# Patient Record
Sex: Female | Born: 1955 | Race: White | Hispanic: No | Marital: Married | Smoking: Never smoker
Health system: Southern US, Community
[De-identification: ages and names within clinical notes are randomized; demographics above are authoritative.]

---

## 2004-08-12 ENCOUNTER — Other Ambulatory Visit: Admission: RE | Admit: 2004-08-12 | Discharge: 2004-08-12 | Payer: Self-pay | Admitting: Diagnostic Radiology

## 2008-06-20 ENCOUNTER — Ambulatory Visit (HOSPITAL_COMMUNITY): Admission: RE | Admit: 2008-06-20 | Discharge: 2008-06-22 | Payer: Self-pay | Admitting: Surgery

## 2008-06-20 ENCOUNTER — Encounter (INDEPENDENT_AMBULATORY_CARE_PROVIDER_SITE_OTHER): Payer: Self-pay | Admitting: Surgery

## 2008-08-01 IMAGING — CR DG CHEST 2V
2 series · 2 of 2 positions shown · non-contrast
Comparison: None

CLINICAL DATA: Preoperative respiratory exam for thyroidectomy

CHEST - 2 VIEW

[view not recorded (1 of 2)]
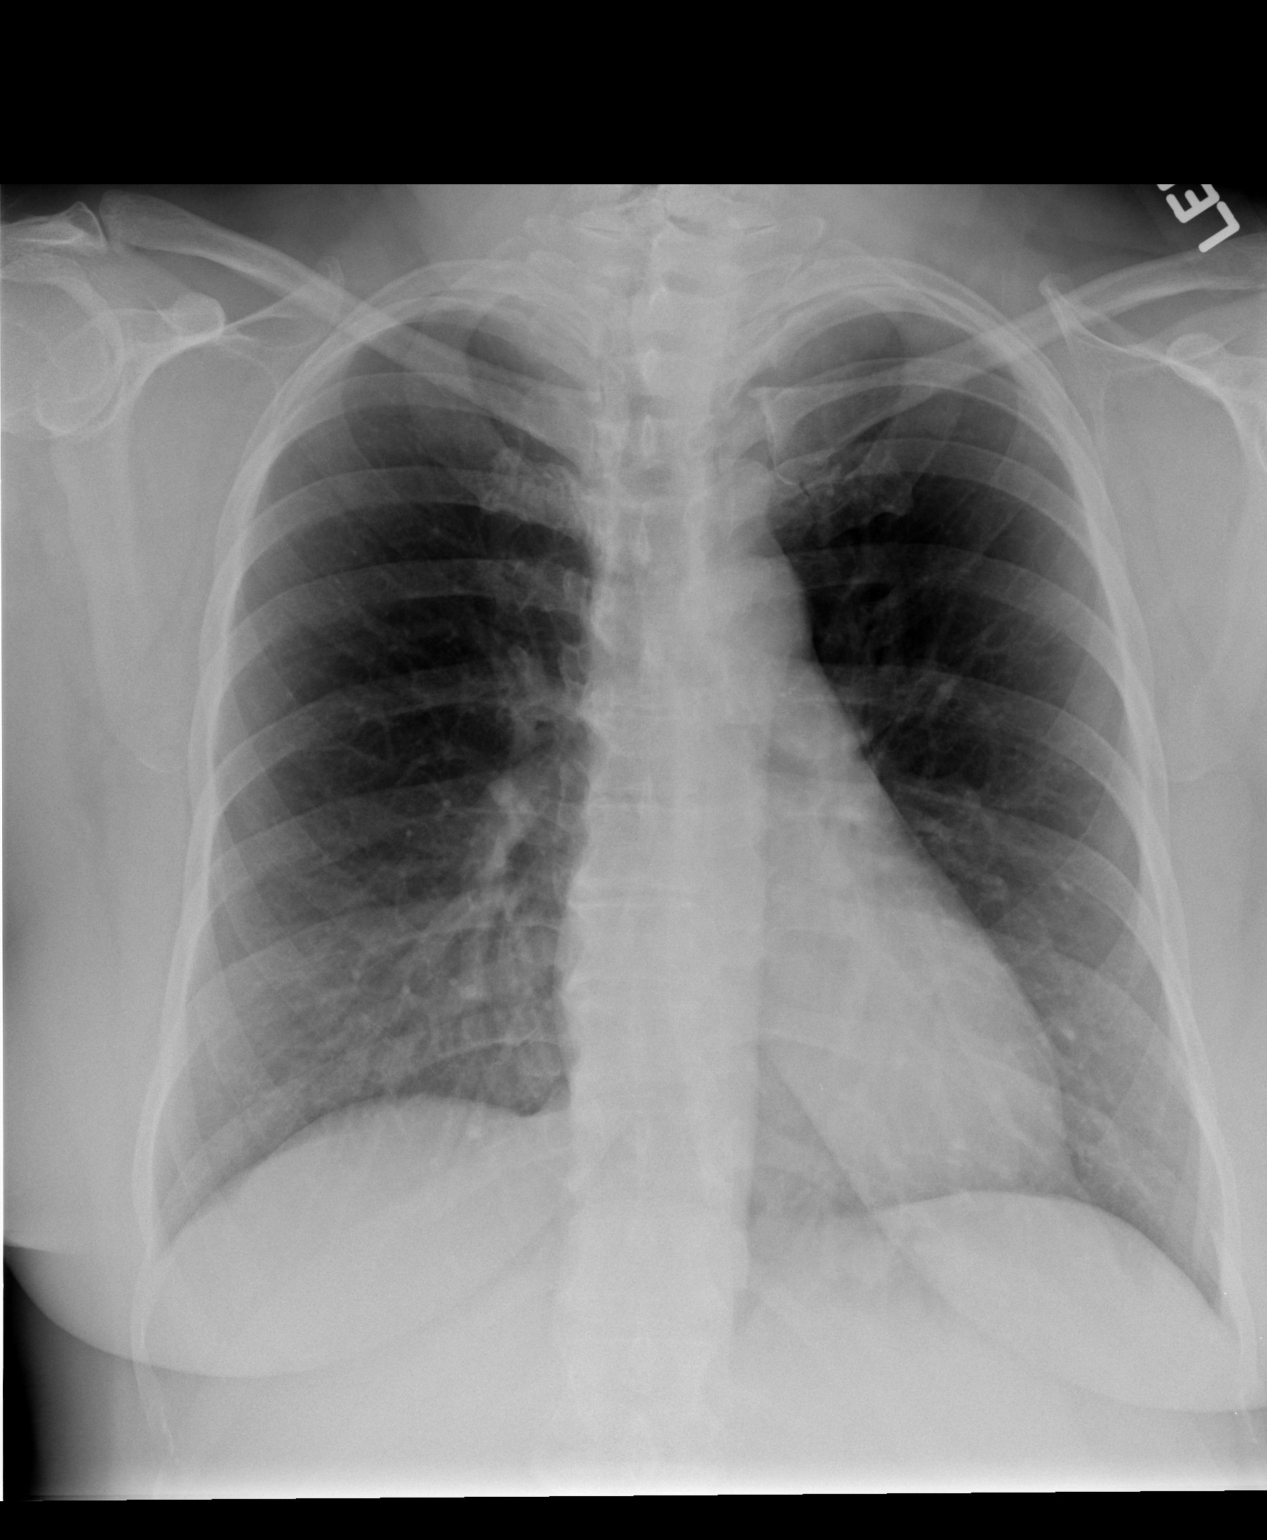

[view not recorded (2 of 2)]
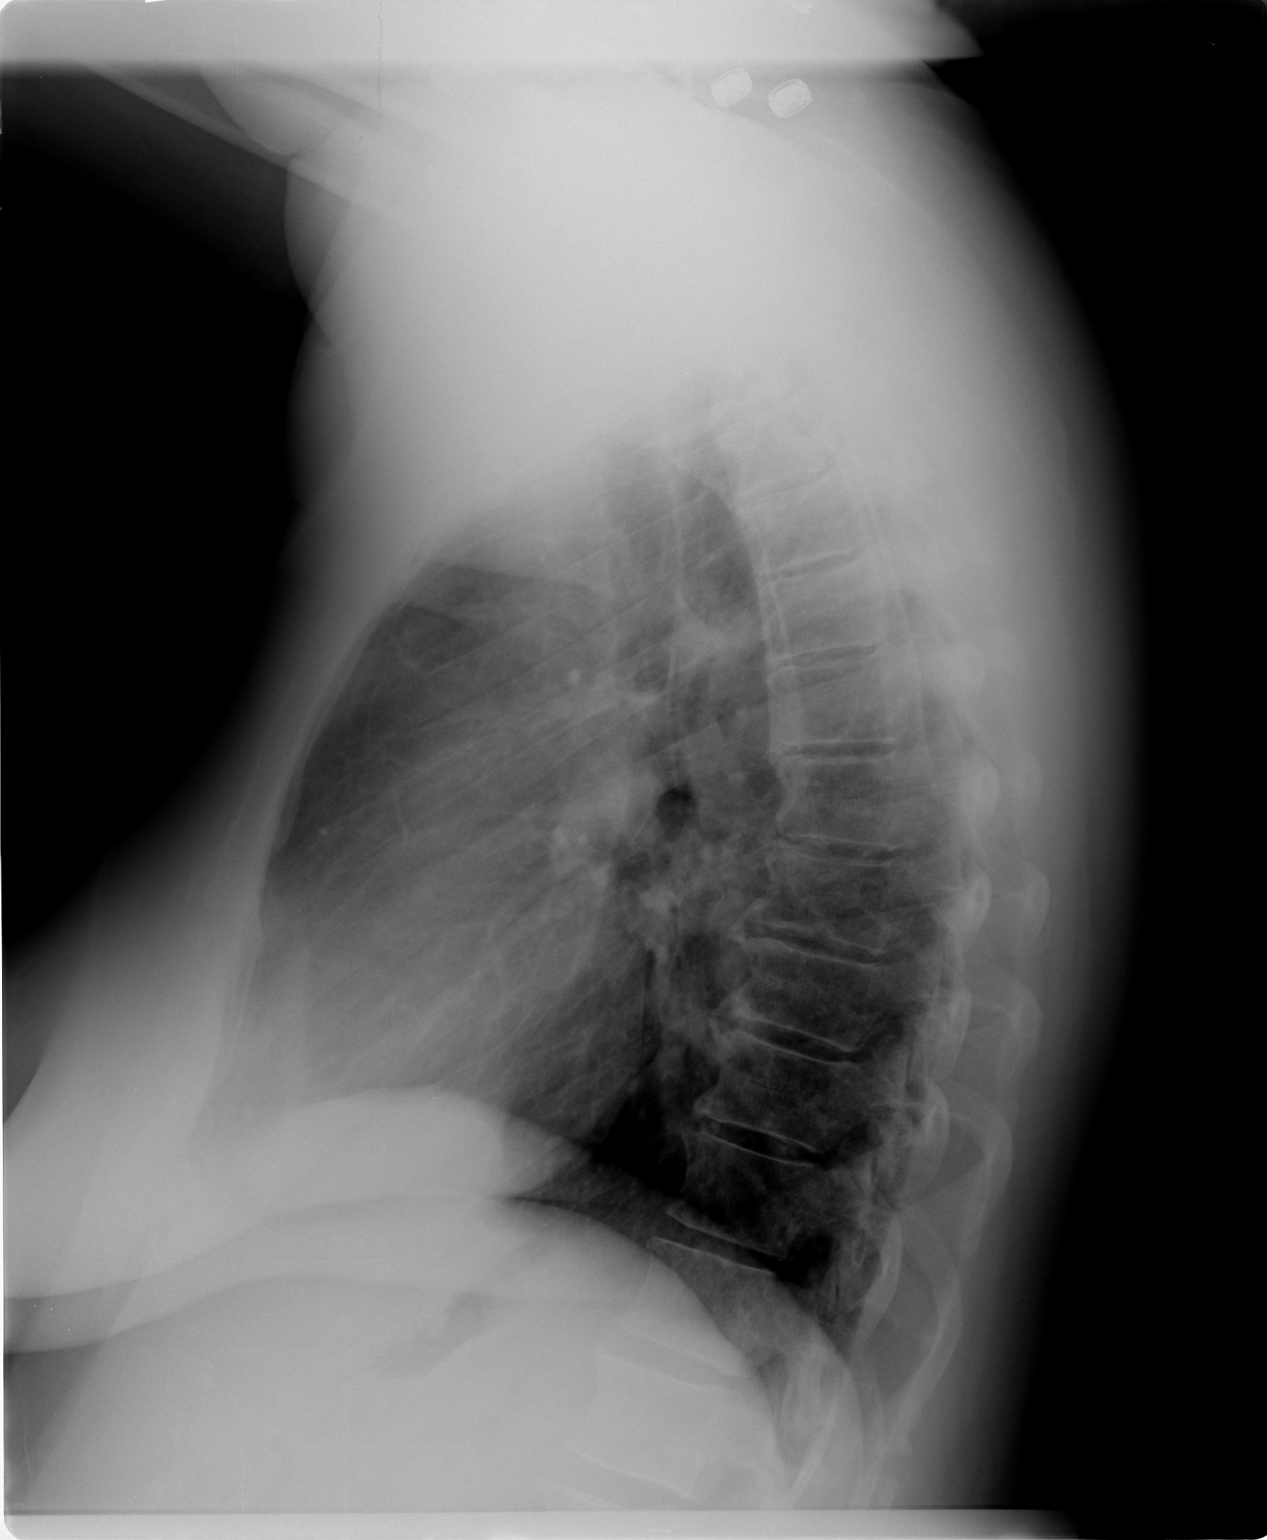

[2 of 2 positions shown; findings below may reference images not displayed]

FINDINGS: Heart size is normal.  There is a superior mediastinal
mass on the left consistent with extension of the thyroid goiter to
the thoracic inlet.  The lungs are clear.  No effusions.  Ordinary
degenerative changes effect the spine.
IMPRESSION: Neck/superior mediastinal mass on the left.  No active
cardiopulmonary process.

## 2011-04-20 NOTE — Op Note (Signed)
[name], [name]                   ACCOUNT NO.:  [id number]   MEDICAL RECORD NO.:  [id number]          PATIENT TYPE:  OIB   LOCATION:  3304                         FACILITY:  [facility]   PHYSICIAN:  Velora Heckler, MD      DATE OF BIRTH:  [birth year]   DATE OF PROCEDURE:  06/20/2008  DATE OF DISCHARGE:                               OPERATIVE REPORT   PREOPERATIVE DIAGNOSIS:  Thyroid goiter with compressive symptoms.   POSTOPERATIVE DIAGNOSIS:  Thyroid goiter with compressive symptoms.   PROCEDURE:  Total thyroidectomy.   SURGEON:  Velora Heckler, MD, FACS   ASSISTANT:  Juanetta Gosling, MD   ANESTHESIA:  General per Dr Jairo Ben, MD   ESTIMATED BLOOD LOSS:  150 mL   PREPARATION:  Betadine.   COMPLICATIONS:  None.   INDICATIONS:  The patient is a 55 year old white female from  [place], [place].  She has a 25-year history of known thyroid  goiter on thyroid hormone suppressive therapy with Synthroid.  She has  had previous fine-needle biopsies, which have shown no suspicion of  malignancy.  However, the gland has continued to enlarge and she has  developed compressive symptoms including solid food dysphagia and  shortness of breath.  The patient was referred by Dr. Casimiro Needle Altheimer  for consideration for thyroidectomy.   BODY OF REPORT:  Procedure was done in OR #17 at the [facility].  The patient was brought to the operating room,  placed in a supine position on the operative room table.  Anesthesia was  induced by Dr. Jairo Ben.  The patient underwent an awake  intubation with some difficulty.  It took greater than 1 hour to achieve  an airway under controlled circumstances.  The patient tolerated this  well.   After successful intubation and administration of general anesthesia,  the patient was positioned and then prepped and draped in the usual  strict aseptic fashion.  After ascertaining that an adequate level of  anesthesia has been achieved, a Kocher incision was made with a #15  blade.  Dissection was carried through subcutaneous tissues and platysma  and hemostasis obtained with electrocautery.  External jugular veins  were divided between hemostats and ligated with 2-0 silk ties.  Skin  flaps were elevated cephalad and caudad from the thyroid notch to the  sternal notch.  A Mahorner self-retaining retractor was placed for  exposure.  Strap muscles were incised in the midline.  There was a  dominant mass measuring approximately 5-6 cm in size in the thyroid  isthmus.  This was gently dissected out.  Gland was quite vascular.  Dissection was begun on the left side.  Strap muscles were reflected  laterally and the left thyroid lobe was exposed.  Left lobe was markedly  enlarged.  It measured approximately 10-12 cm in greatest dimension.  With gentle blunt dissection, the gland was mobilized and delivered  anterior to the strap muscles.  Superior pole vessels were then divided  between hemoclips with the harmonic scalpel and larger vessels were  ligated  in continuity with 2-0 silk ties and then divided with the  harmonic scalpel.  This provides for better mobilization of the gland.  Inferior venous tributaries were ligated in continuity with 2-0 silk  ties and divided with harmonic scalpel.  Gland was rolled anteriorly.  Parathyroid tissue was identified and preserved.  Dissection was  maintained on the thyroid capsule in hopes of avoiding injury to the  underlying recurrent nerve, which was difficult to visualize given the  size and distortion of the neck anatomy.  Dissection was carried down to  branches of the inferior thyroid artery, which were divided between  small Ligaclips with harmonic scalpel.  Ligament of Allyson Sabal was transected  and the gland was mobilized up and onto the anterior trachea.  A small  pyramidal lobe was dissected out and included with the specimen.  Isthmus was mobilized  across the midline with the electrocautery used  for hemostasis.  Dry packs were placed in the left neck.   Next, we turned our attention to the right thyroid lobe.  Again strap  muscles were reflected laterally and the right lobe was exposed.  Right  lobe was not as large as the left lobe.  It measured approximately 8 cm  in greatest dimension.  However, the right lobe was located more  posteriorly and extends to the precervical fascia behind the esophagus.  It was gently mobilized.  Superior pole vessels were ligated in  continuity with 2-0 silk ties and divided with the harmonic scalpel.  Remaining vascular tributaries to the superior pole were divided between  medium Ligaclips with the harmonic scalpel.  Middle thyroid vein was  divided between medium Ligaclips with harmonic scalpel.  Inferior venous  tributaries were ligated in continuity with 2-0 silk ties and divided  with the harmonic scalpel.  Gland was rolled anteriorly.  Both the  superior and inferior parathyroid glands were dissected off the thyroid  capsule and maintained on their vascular pedicle.  The branches of the  inferior thyroid artery were divided between Ligaclips with the harmonic  scalpel.  The gland was rolled anteriorly.  Ligament of Allyson Sabal was  transected with the electrocautery.  Gland was mobilized onto the  anterior trachea from which it was completely excised with the harmonic  scalpel.  Sutures used to mark the left superior pole.  Gland was  sectioned on the back table for instructional purposes as there were  students present.  The entire gland was submitted to Pathology for  review.  Neck was irrigated copiously with warm saline and good  hemostasis was achieved.  Surgicel was placed in the operative field.  Strap muscles were reapproximated in the midline with interrupted 3-0  Vicryl sutures.  Platysma was closed with interrupted 3-0 Vicryl  sutures.  Skin was closed with running 4-0 Monocryl  subcuticular suture.  Wound was washed and dried and Benzoin and Steri-Strips were applied.  Five small skin tags were excised off the neck with the use of the  electrocautery.  Dressings were then applied.  The patient was awakened  from anesthesia.  Dr. Jairo Ben inserted a glide scope and  visualized the cords easily before extubation.  The patient was brought  to the recovery room in stable condition.  The patient tolerated the  procedure well.      Velora Heckler, MD  Electronically Signed     TMG/MEDQ  D:  06/20/2008  T:  06/21/2008  Job:  [id number]   cc:   Veverly Fells.  Altheimer, M.D.  [facility] [place] [facility]

## 2011-09-02 LAB — COMPREHENSIVE METABOLIC PANEL
AST: 23
Albumin: 4.5
Calcium: 9.6
Creatinine, Ser: 1.02
GFR calc Af Amer: >60
GFR calc non Af Amer: 57 — ABNORMAL LOW
Total Protein: 7

## 2011-09-02 LAB — URINALYSIS, ROUTINE W REFLEX MICROSCOPIC
Hgb urine dipstick: NEGATIVE
Specific Gravity, Urine: 1.022
Urobilinogen, UA: 0.2

## 2011-09-02 LAB — URINE MICROSCOPIC-ADD ON

## 2011-09-02 LAB — CBC
MCHC: 34.4
MCV: 93.8
Platelets: 312
RDW: 13.2

## 2011-09-02 LAB — PROTIME-INR: Prothrombin Time: 12

## 2011-09-02 LAB — DIFFERENTIAL
Eosinophils Relative: 3
Lymphocytes Relative: 24
Lymphs Abs: 2.4
Monocytes Absolute: 0.6

## 2011-09-03 LAB — CALCIUM: Calcium: 8.4

## 2013-05-12 DIAGNOSIS — M5137 Other intervertebral disc degeneration, lumbosacral region: Secondary | ICD-10-CM | POA: Insufficient documentation

## 2013-05-12 DIAGNOSIS — E119 Type 2 diabetes mellitus without complications: Secondary | ICD-10-CM | POA: Insufficient documentation

## 2013-05-12 DIAGNOSIS — F329 Major depressive disorder, single episode, unspecified: Secondary | ICD-10-CM | POA: Insufficient documentation

## 2015-09-10 DIAGNOSIS — I10 Essential (primary) hypertension: Secondary | ICD-10-CM | POA: Insufficient documentation

## 2015-09-10 DIAGNOSIS — Z79810 Long term (current) use of selective estrogen receptor modulators (SERMs): Secondary | ICD-10-CM | POA: Insufficient documentation

## 2015-09-10 DIAGNOSIS — R7989 Other specified abnormal findings of blood chemistry: Secondary | ICD-10-CM | POA: Insufficient documentation

## 2015-09-10 DIAGNOSIS — C50911 Malignant neoplasm of unspecified site of right female breast: Secondary | ICD-10-CM | POA: Insufficient documentation

## 2016-01-15 DIAGNOSIS — R5383 Other fatigue: Secondary | ICD-10-CM | POA: Insufficient documentation

## 2016-01-22 DIAGNOSIS — E039 Hypothyroidism, unspecified: Secondary | ICD-10-CM | POA: Insufficient documentation

## 2016-05-17 DIAGNOSIS — M545 Low back pain, unspecified: Secondary | ICD-10-CM | POA: Insufficient documentation

## 2016-05-17 DIAGNOSIS — G43009 Migraine without aura, not intractable, without status migrainosus: Secondary | ICD-10-CM | POA: Insufficient documentation

## 2016-05-17 DIAGNOSIS — M50222 Other cervical disc displacement at C5-C6 level: Secondary | ICD-10-CM | POA: Insufficient documentation

## 2016-05-17 DIAGNOSIS — G5681 Other specified mononeuropathies of right upper limb: Secondary | ICD-10-CM | POA: Insufficient documentation

## 2016-08-24 DIAGNOSIS — J309 Allergic rhinitis, unspecified: Secondary | ICD-10-CM | POA: Insufficient documentation

## 2016-08-24 DIAGNOSIS — Z853 Personal history of malignant neoplasm of breast: Secondary | ICD-10-CM | POA: Insufficient documentation

## 2016-09-01 DIAGNOSIS — M6701 Short Achilles tendon (acquired), right ankle: Secondary | ICD-10-CM | POA: Insufficient documentation

## 2016-09-01 DIAGNOSIS — M2011 Hallux valgus (acquired), right foot: Secondary | ICD-10-CM | POA: Insufficient documentation

## 2016-09-20 DIAGNOSIS — M542 Cervicalgia: Secondary | ICD-10-CM | POA: Insufficient documentation

## 2017-02-07 DIAGNOSIS — E782 Mixed hyperlipidemia: Secondary | ICD-10-CM | POA: Insufficient documentation

## 2017-02-07 DIAGNOSIS — E559 Vitamin D deficiency, unspecified: Secondary | ICD-10-CM | POA: Insufficient documentation

## 2017-09-12 ENCOUNTER — Encounter: Payer: Self-pay | Admitting: Podiatry

## 2017-09-12 DIAGNOSIS — M21619 Bunion of unspecified foot: Secondary | ICD-10-CM | POA: Insufficient documentation

## 2017-09-12 NOTE — Patient Instructions (Signed)

## 2017-09-28 ENCOUNTER — Ambulatory Visit (INDEPENDENT_AMBULATORY_CARE_PROVIDER_SITE_OTHER): Payer: 59 | Admitting: Podiatry

## 2017-09-28 ENCOUNTER — Ambulatory Visit (INDEPENDENT_AMBULATORY_CARE_PROVIDER_SITE_OTHER): Payer: 59

## 2017-09-28 VITALS — BP 108/66 | HR 63 | Resp 16 | Ht 63.0 in | Wt 190.0 lb

## 2017-09-28 DIAGNOSIS — M2010 Hallux valgus (acquired), unspecified foot: Secondary | ICD-10-CM

## 2017-09-28 DIAGNOSIS — M7661 Achilles tendinitis, right leg: Secondary | ICD-10-CM | POA: Diagnosis not present

## 2017-09-28 DIAGNOSIS — M722 Plantar fascial fibromatosis: Secondary | ICD-10-CM | POA: Diagnosis not present

## 2017-09-28 MED ORDER — TRIAMCINOLONE ACETONIDE 10 MG/ML IJ SUSP
10.0000 mg | Freq: Once | INTRAMUSCULAR | Status: AC
  Administered 2017-09-28: 10 mg

## 2017-09-28 NOTE — Progress Notes (Signed)
Subjective:    Patient ID: [name], female   DOB: 61 y.o.   MRN: [id number]   HPI patient presents stating she has a painful bunion deformity right pain in the back of the right heel and the bottom of the right heel and states that she had had 1 injection which helped her temporarily but she is also had stretching exercises and try to boot which has not been successful in helping her. Has seen several other doctors over the last year for this problem and patient does not smoke    Review of Systems  All other systems reviewed and are negative.       Objective:  Physical Exam  Constitutional: She appears well-developed and well-nourished.  Cardiovascular: Intact distal pulses.   Pulmonary/Chest: Effort normal.  Musculoskeletal: Normal range of motion.  Neurological: She is alert.  Skin: Skin is warm.  Nursing note and vitals reviewed.  neurovascular status intact muscle strength adequate range of motion is within normal limits with patient found to have exquisite discomfort in the plantar aspect of the right heel and also discomfort at the musculotendinous junction posterior right heel and mild discomfort at the insertion to the posterior calcaneus. Also found to have large structural bunion deformity clinically with redness around the first metatarsal that she states is increasingly symptomatic over the last several years     Assessment:   Acute plantar fasciitis Achilles tendinitis right with possibility that the Achilles tendon is coming from walking differently because of the plantar heel and structural bunion deformity right with moderate hallux limitus symptoms      Plan:    H&P conditions reviewed and at this point I injected the plantar right heel 3 mg Kenalog 5 mg Xylocaine and applied fascial brace to lift the arch and advised on stretching exercises for the posterior heel. Discussed at one point bunion correction which will probably be necessary but at this time we'll get a  hold off and focus on the heel condition  X-ray indicates there is signs of hallux limitus with the spur on the medial aspect of the first metatarsal with moderate elevation of the intermetatarsal angle

## 2017-09-28 NOTE — Patient Instructions (Addendum)
Bunion A bunion is a bump on the base of the big toe that forms when the bones of the big toe joint move out of position. Bunions may be small at first, but they often get larger over time. The can make walking painful. What are the causes? A bunion may be caused by:  Wearing narrow or pointed shoes that force the big toe to press against the other toes.  Abnormal foot development that causes the foot to roll inward (pronate).  Changes in the foot that are caused by certain diseases, such as rheumatoid arthritis and polio.  A foot injury.  What increases the risk? The following factors may make you more likely to develop this condition:  Wearing shoes that squeeze the toes together.  Having certain diseases, such as: ? Rheumatoid arthritis. ? Polio. ? Cerebral palsy.  Having family members who have bunions.  Being born with a foot deformity, such as flat feet or low arches.  Doing activities that put a lot of pressure on the feet, such as ballet dancing.  What are the signs or symptoms? The main symptom of a bunion is a noticeable bump on the big toe. Other symptoms may include:  Pain.  Swelling around the big toe.  Redness and inflammation.  Thick or hardened skin on the big toe or between the toes.  Stiffness or loss of motion in the big toe.  Trouble with walking.  How is this diagnosed? A bunion may be diagnosed based on your symptoms, medical history, and activities. You may have tests, such as:  X-rays. These allow your health care provider to check the position of the bones in your foot and look for damage to your joint. They also help your health care provider to determine the severity of your bunion and the best way to treat it.  Joint aspiration. In this test, a sample of fluid is removed from the toe joint. This test, which may be done if you are in a lot of pain, helps to rule out diseases that cause painful swelling of the joints, such as  arthritis.  How is this treated? There is no cure for a bunion, but treatment can help to prevent a bunion from getting worse. Treatment depends on the severity of your symptoms. Your health care provider may recommend:  Wearing shoes that have a wide toe box.  Using bunion pads to cushion the affected area.  Taping your toes together to keep them in a normal position.  Placing a device inside your shoe (orthotics) to help reduce pressure on your toe joint.  Taking medicine to ease pain, inflammation, and swelling.  Applying heat or ice to the affected area.  Doing stretching exercises.  Surgery to remove scar tissue and move the toes back into their normal position. This treatment is rare.  Follow these instructions at home:  Support your toe joint with proper footwear, shoe padding, or taping as told by your health care provider.  Take over-the-counter and prescription medicines only as told by your health care provider.  If directed, apply ice to the injured area: ? Put ice in a plastic bag. ? Place a towel between your skin and the bag. ? Leave the ice on for 20 minutes, 2-3 times per day.  If directed, apply heat to the affected area before you exercise. Use the heat source that your health care provider recommends, such as a moist heat pack or a heating pad. ? Place a towel between your   skin and the heat source. ? Leave the heat on for 20-30 minutes. ? Remove the heat if your skin turns bright red. This is especially important if you are unable to feel pain, heat, or cold. You may have a greater risk of getting burned.  Do exercises as told by your health care provider.  Keep all follow-up visits as told by your health care provider. Contact a health care provider if:  Your symptoms get worse.  Your symptoms do not improve in 2 weeks. Get help right away if:  You have severe pain and trouble with walking. This information is not intended to replace advice given  to you by your health care provider. Make sure you discuss any questions you have with your health care provider. Document Released: 11/22/2005 Document Revised: 04/29/2016 Document Reviewed: 06/22/2015 [employer] Interactive Patient Education  2018 [place].    Achilles Tendinitis  with Rehab Achilles tendinitis is a disorder of the Achilles tendon. The Achilles tendon connects the large calf muscles (Gastrocnemius and Soleus) to the heel bone (calcaneus). This tendon is sometimes called the heel cord. It is important for pushing-off and standing on your toes and is important for walking, running, or jumping. Tendinitis is often caused by overuse and repetitive microtrauma. SYMPTOMS  Pain, tenderness, swelling, warmth, and redness may occur over the Achilles tendon even at rest.  Pain with pushing off, or flexing or extending the ankle.  Pain that is worsened after or during activity. CAUSES   Overuse sometimes seen with rapid increase in exercise programs or in sports requiring running and jumping.  Poor physical conditioning (strength and flexibility or endurance).  Running sports, especially training running down hills.  Inadequate warm-up before practice or play or failure to stretch before participation.  Injury to the tendon. PREVENTION   Warm up and stretch before practice or competition.  Allow time for adequate rest and recovery between practices and competition.  Keep up conditioning.  Keep up ankle and leg flexibility.  Improve or keep muscle strength and endurance.  Improve cardiovascular fitness.  Use proper technique.  Use proper equipment (shoes, skates).  To help prevent recurrence, taping, protective strapping, or an adhesive bandage may be recommended for several weeks after healing is complete. PROGNOSIS   Recovery may take weeks to several months to heal.  Longer recovery is expected if symptoms have been prolonged.  Recovery is usually  quicker if the inflammation is due to a direct blow as compared with overuse or sudden strain. RELATED COMPLICATIONS   Healing time will be prolonged if the condition is not correctly treated. The injury must be given plenty of time to heal.  Symptoms can reoccur if activity is resumed too soon.  Untreated, tendinitis may increase the risk of tendon rupture requiring additional time for recovery and possibly surgery. TREATMENT   The first treatment consists of rest anti-inflammatory medication, and ice to relieve the pain.  Stretching and strengthening exercises after resolution of pain will likely help reduce the risk of recurrence. Referral to a physical therapist or athletic trainer for further evaluation and treatment may be helpful.  A walking boot or cast may be recommended to rest the Achilles tendon. This can help break the cycle of inflammation and microtrauma.  Arch supports (orthotics) may be prescribed or recommended by your caregiver as an adjunct to therapy and rest.  Surgery to remove the inflamed tendon lining or degenerated tendon tissue is rarely necessary and has shown less than predictable results. MEDICATION  Nonsteroidal anti-inflammatory medications, such as aspirin and ibuprofen, may be used for pain and inflammation relief. Do not take within 7 days before surgery. Take these as directed by your caregiver. Contact your caregiver immediately if any bleeding, stomach upset, or signs of allergic reaction occur. Other minor pain relievers, such as acetaminophen, may also be used.  Pain relievers may be prescribed as necessary by your caregiver. Do not take prescription pain medication for longer than 4 to 7 days. Use only as directed and only as much as you need.  Cortisone injections are rarely indicated. Cortisone injections may weaken tendons and predispose to rupture. It is better to give the condition more time to heal than to use them. HEAT AND COLD  Cold is  used to relieve pain and reduce inflammation for acute and chronic Achilles tendinitis. Cold should be applied for 10 to 15 minutes every 2 to 3 hours for inflammation and pain and immediately after any activity that aggravates your symptoms. Use ice packs or an ice massage.  Heat may be used before performing stretching and strengthening activities prescribed by your caregiver. Use a heat pack or a warm soak. SEEK MEDICAL CARE IF:  Symptoms get worse or do not improve in 2 weeks despite treatment.  New, unexplained symptoms develop. Drugs used in treatment may produce side effects.  EXERCISES:  RANGE OF MOTION (ROM) AND STRETCHING EXERCISES - Achilles Tendinitis  These exercises may help you when beginning to rehabilitate your injury. Your symptoms may resolve with or without further involvement from your physician, physical therapist or athletic trainer. While completing these exercises, remember:   Restoring tissue flexibility helps normal motion to return to the joints. This allows healthier, less painful movement and activity.  An effective stretch should be held for at least 30 seconds.  A stretch should never be painful. You should only feel a gentle lengthening or release in the stretched tissue.  STRETCH  Gastroc, Standing   Place hands on wall.  Extend right / left leg, keeping the front knee somewhat bent.  Slightly point your toes inward on your back foot.  Keeping your right / left heel on the floor and your knee straight, shift your weight toward the wall, not allowing your back to arch.  You should feel a gentle stretch in the right / left calf. Hold this position for 10 seconds. Repeat 3 times. Complete this stretch 2 times per day.  STRETCH  Soleus, Standing   Place hands on wall.  Extend right / left leg, keeping the other knee somewhat bent.  Slightly point your toes inward on your back foot.  Keep your right / left heel on the floor, bend your back knee,  and slightly shift your weight over the back leg so that you feel a gentle stretch deep in your back calf.  Hold this position for 10 seconds. Repeat 3 times. Complete this stretch 2 times per day.  STRETCH  Gastrocsoleus, Standing  Note: This exercise can place a lot of stress on your foot and ankle. Please complete this exercise only if specifically instructed by your caregiver.   Place the ball of your right / left foot on a step, keeping your other foot firmly on the same step.  Hold on to the wall or a rail for balance.  Slowly lift your other foot, allowing your body weight to press your heel down over the edge of the step.  You should feel a stretch in your right / left  calf.  Hold this position for 10 seconds.  Repeat this exercise with a slight bend in your knee. Repeat 3 times. Complete this stretch 2 times per day.   STRENGTHENING EXERCISES - Achilles Tendinitis These exercises may help you when beginning to rehabilitate your injury. They may resolve your symptoms with or without further involvement from your physician, physical therapist or athletic trainer. While completing these exercises, remember:   Muscles can gain both the endurance and the strength needed for everyday activities through controlled exercises.  Complete these exercises as instructed by your physician, physical therapist or athletic trainer. Progress the resistance and repetitions only as guided.  You may experience muscle soreness or fatigue, but the pain or discomfort you are trying to eliminate should never worsen during these exercises. If this pain does worsen, stop and make certain you are following the directions exactly. If the pain is still present after adjustments, discontinue the exercise until you can discuss the trouble with your clinician.  STRENGTH - Plantar-flexors   Sit with your right / left leg extended. Holding onto both ends of a rubber exercise band/tubing, loop it around the  ball of your foot. Keep a slight tension in the band.  Slowly push your toes away from you, pointing them downward.  Hold this position for 10 seconds. Return slowly, controlling the tension in the band/tubing. Repeat 3 times. Complete this exercise 2 times per day.   STRENGTH - Plantar-flexors   Stand with your feet shoulder width apart. Steady yourself with a wall or table using as little support as needed.  Keeping your weight evenly spread over the width of your feet, rise up on your toes.*  Hold this position for 10 seconds. Repeat 3 times. Complete this exercise 2 times per day.  *If this is too easy, shift your weight toward your right / left leg until you feel challenged. Ultimately, you may be asked to do this exercise with your right / left foot only.  STRENGTH  Plantar-flexors, Eccentric  Note: This exercise can place a lot of stress on your foot and ankle. Please complete this exercise only if specifically instructed by your caregiver.   Place the balls of your feet on a step. With your hands, use only enough support from a wall or rail to keep your balance.  Keep your knees straight and rise up on your toes.  Slowly shift your weight entirely to your right / left toes and pick up your opposite foot. Gently and with controlled movement, lower your weight through your right / left foot so that your heel drops below the level of the step. You will feel a slight stretch in the back of your calf at the end position.  Use the healthy leg to help rise up onto the balls of both feet, then lower weight only on the right / left leg again. Build up to 15 repetitions. Then progress to 3 consecutive sets of 15 repetitions.*  After completing the above exercise, complete the same exercise with a slight knee bend (about 30 degrees). Again, build up to 15 repetitions. Then progress to 3 consecutive sets of 15 repetitions.* Perform this exercise 2 times per day.  *When you easily complete 3  sets of 15, your physician, physical therapist or athletic trainer may advise you to add resistance by wearing a backpack filled with additional weight.  STRENGTH - Plantar Flexors, Seated   Sit on a chair that allows your feet to rest flat on the  ground. If necessary, sit at the edge of the chair.  Keeping your toes firmly on the ground, lift your right / left heel as far as you can without increasing any discomfort in your ankle. Repeat 3 times. Complete this exercise 2 times a day.   Plantar Fasciitis (Heel Spur Syndrome) with Rehab The plantar fascia is a fibrous, ligament-like, soft-tissue structure that spans the bottom of the foot. Plantar fasciitis is a condition that causes pain in the foot due to inflammation of the tissue. SYMPTOMS   Pain and tenderness on the underneath side of the foot.  Pain that worsens with standing or walking. CAUSES  Plantar fasciitis is caused by irritation and injury to the plantar fascia on the underneath side of the foot. Common mechanisms of injury include:  Direct trauma to bottom of the foot.  Damage to a small nerve that runs under the foot where the main fascia attaches to the heel bone.  Stress placed on the plantar fascia due to bone spurs. RISK INCREASES WITH:   Activities that place stress on the plantar fascia (running, jumping, pivoting, or cutting).  Poor strength and flexibility.  Improperly fitted shoes.  Tight calf muscles.  Flat feet.  Failure to warm-up properly before activity.  Obesity. PREVENTION  Warm up and stretch properly before activity.  Allow for adequate recovery between workouts.  Maintain physical fitness:  Strength, flexibility, and endurance.  Cardiovascular fitness.  Maintain a health body weight.  Avoid stress on the plantar fascia.  Wear properly fitted shoes, including arch supports for individuals who have flat feet.  PROGNOSIS  If treated properly, then the symptoms of plantar  fasciitis usually resolve without surgery. However, occasionally surgery is necessary.  RELATED COMPLICATIONS   Recurrent symptoms that may result in a chronic condition.  Problems of the lower back that are caused by compensating for the injury, such as limping.  Pain or weakness of the foot during push-off following surgery.  Chronic inflammation, scarring, and partial or complete fascia tear, occurring more often from repeated injections.  TREATMENT  Treatment initially involves the use of ice and medication to help reduce pain and inflammation. The use of strengthening and stretching exercises may help reduce pain with activity, especially stretches of the Achilles tendon. These exercises may be performed at home or with a therapist. Your caregiver may recommend that you use heel cups of arch supports to help reduce stress on the plantar fascia. Occasionally, corticosteroid injections are given to reduce inflammation. If symptoms persist for greater than 6 months despite non-surgical (conservative), then surgery may be recommended.   MEDICATION   If pain medication is necessary, then nonsteroidal anti-inflammatory medications, such as aspirin and ibuprofen, or other minor pain relievers, such as acetaminophen, are often recommended.  Do not take pain medication within 7 days before surgery.  Prescription pain relievers may be given if deemed necessary by your caregiver. Use only as directed and only as much as you need.  Corticosteroid injections may be given by your caregiver. These injections should be reserved for the most serious cases, because they may only be given a certain number of times.  HEAT AND COLD  Cold treatment (icing) relieves pain and reduces inflammation. Cold treatment should be applied for 10 to 15 minutes every 2 to 3 hours for inflammation and pain and immediately after any activity that aggravates your symptoms. Use ice packs or massage the area with a piece of  ice (ice massage).  Heat treatment may be  used prior to performing the stretching and strengthening activities prescribed by your caregiver, physical therapist, or athletic trainer. Use a heat pack or soak the injury in warm water.  SEEK IMMEDIATE MEDICAL CARE IF:  Treatment seems to offer no benefit, or the condition worsens.  Any medications produce adverse side effects.  EXERCISES- RANGE OF MOTION (ROM) AND STRETCHING EXERCISES - Plantar Fasciitis (Heel Spur Syndrome) These exercises may help you when beginning to rehabilitate your injury. Your symptoms may resolve with or without further involvement from your physician, physical therapist or athletic trainer. While completing these exercises, remember:   Restoring tissue flexibility helps normal motion to return to the joints. This allows healthier, less painful movement and activity.  An effective stretch should be held for at least 30 seconds.  A stretch should never be painful. You should only feel a gentle lengthening or release in the stretched tissue.  RANGE OF MOTION - Toe Extension, Flexion  Sit with your right / left leg crossed over your opposite knee.  Grasp your toes and gently pull them back toward the top of your foot. You should feel a stretch on the bottom of your toes and/or foot.  Hold this stretch for 10 seconds.  Now, gently pull your toes toward the bottom of your foot. You should feel a stretch on the top of your toes and or foot.  Hold this stretch for 10 seconds. Repeat  times. Complete this stretch 3 times per day.   RANGE OF MOTION - Ankle Dorsiflexion, Active Assisted  Remove shoes and sit on a chair that is preferably not on a carpeted surface.  Place right / left foot under knee. Extend your opposite leg for support.  Keeping your heel down, slide your right / left foot back toward the chair until you feel a stretch at your ankle or calf. If you do not feel a stretch, slide your bottom forward  to the edge of the chair, while still keeping your heel down.  Hold this stretch for 10 seconds. Repeat 3 times. Complete this stretch 2 times per day.   STRETCH  Gastroc, Standing  Place hands on wall.  Extend right / left leg, keeping the front knee somewhat bent.  Slightly point your toes inward on your back foot.  Keeping your right / left heel on the floor and your knee straight, shift your weight toward the wall, not allowing your back to arch.  You should feel a gentle stretch in the right / left calf. Hold this position for 10 seconds. Repeat 3 times. Complete this stretch 2 times per day.  STRETCH  Soleus, Standing  Place hands on wall.  Extend right / left leg, keeping the other knee somewhat bent.  Slightly point your toes inward on your back foot.  Keep your right / left heel on the floor, bend your back knee, and slightly shift your weight over the back leg so that you feel a gentle stretch deep in your back calf.  Hold this position for 10 seconds. Repeat 3 times. Complete this stretch 2 times per day.  STRETCH  Gastrocsoleus, Standing  Note: This exercise can place a lot of stress on your foot and ankle. Please complete this exercise only if specifically instructed by your caregiver.   Place the ball of your right / left foot on a step, keeping your other foot firmly on the same step.  Hold on to the wall or a rail for balance.  Slowly lift your  other foot, allowing your body weight to press your heel down over the edge of the step.  You should feel a stretch in your right / left calf.  Hold this position for 10 seconds.  Repeat this exercise with a slight bend in your right / left knee. Repeat 3 times. Complete this stretch 2 times per day.   STRENGTHENING EXERCISES - Plantar Fasciitis (Heel Spur Syndrome)  These exercises may help you when beginning to rehabilitate your injury. They may resolve your symptoms with or without further involvement from  your physician, physical therapist or athletic trainer. While completing these exercises, remember:   Muscles can gain both the endurance and the strength needed for everyday activities through controlled exercises.  Complete these exercises as instructed by your physician, physical therapist or athletic trainer. Progress the resistance and repetitions only as guided.  STRENGTH - Towel Curls  Sit in a chair positioned on a non-carpeted surface.  Place your foot on a towel, keeping your heel on the floor.  Pull the towel toward your heel by only curling your toes. Keep your heel on the floor. Repeat 3 times. Complete this exercise 2 times per day.  STRENGTH - Ankle Inversion  Secure one end of a rubber exercise band/tubing to a fixed object (table, pole). Loop the other end around your foot just before your toes.  Place your fists between your knees. This will focus your strengthening at your ankle.  Slowly, pull your big toe up and in, making sure the band/tubing is positioned to resist the entire motion.  Hold this position for 10 seconds.  Have your muscles resist the band/tubing as it slowly pulls your foot back to the starting position. Repeat 3 times. Complete this exercises 2 times per day.  Document Released: 11/22/2005 Document Revised: 02/14/2012 Document Reviewed: 03/06/2009 [place] Patient Information 2014 [place], [place].

## 2017-10-05 NOTE — Progress Notes (Signed)
This encounter was created in error - please disregard.

## 2017-10-06 DIAGNOSIS — G894 Chronic pain syndrome: Secondary | ICD-10-CM | POA: Insufficient documentation

## 2017-10-06 DIAGNOSIS — M5416 Radiculopathy, lumbar region: Secondary | ICD-10-CM | POA: Insufficient documentation

## 2017-10-12 ENCOUNTER — Ambulatory Visit: Payer: 59 | Admitting: Podiatry

## 2017-10-12 ENCOUNTER — Encounter: Payer: Self-pay | Admitting: Podiatry

## 2017-10-12 DIAGNOSIS — M2010 Hallux valgus (acquired), unspecified foot: Secondary | ICD-10-CM

## 2017-10-12 DIAGNOSIS — M722 Plantar fascial fibromatosis: Secondary | ICD-10-CM | POA: Diagnosis not present

## 2017-10-12 NOTE — Patient Instructions (Signed)
Pre-Operative Instructions  Congratulations, you have decided to take an important step towards improving your quality of life.  You can be assured that the doctors and staff at [facility] will be with you every step of the way.  Here are some important things you should know:  1. Plan to be at the surgery center/hospital at least 1 (one) hour prior to your scheduled time, unless otherwise directed by the surgical center/hospital staff.  You must have a responsible adult accompany you, remain during the surgery and drive you home.  Make sure you have directions to the surgical center/hospital to ensure you arrive on time. 2. If you are having surgery at [facility] or [facility], you will need a copy of your medical history and physical form from your family physician within one month prior to the date of surgery. We will give you a form for your primary physician to complete.  3. We make every effort to accommodate the date you request for surgery.  However, there are times where surgery dates or times have to be moved.  We will contact you as soon as possible if a change in schedule is required.   4. No aspirin/ibuprofen for one week before surgery.  If you are on aspirin, any non-steroidal anti-inflammatory medications (Mobic, Aleve, Ibuprofen) should not be taken seven (7) days prior to your surgery.  You make take Tylenol for pain prior to surgery.  5. Medications - If you are taking daily heart and blood pressure medications, seizure, reflux, allergy, asthma, anxiety, pain or diabetes medications, make sure you notify the surgery center/hospital before the day of surgery so they can tell you which medications you should take or avoid the day of surgery. 6. No food or drink after midnight the night before surgery unless directed otherwise by surgical center/hospital staff. 7. No alcoholic beverages 24-hours prior to surgery.  No smoking 24-hours prior or 24-hours after  surgery. 8. Wear loose pants or shorts. They should be loose enough to fit over bandages, boots, and casts. 9. Don't wear slip-on shoes. Sneakers are preferred. 10. Bring your boot with you to the surgery center/hospital.  Also bring crutches or a walker if your physician has prescribed it for you.  If you do not have this equipment, it will be provided for you after surgery. 11. If you have not been contacted by the surgery center/hospital by the day before your surgery, call to confirm the date and time of your surgery. 12. Leave-time from work may vary depending on the type of surgery you have.  Appropriate arrangements should be made prior to surgery with your employer. 13. Prescriptions will be provided immediately following surgery by your doctor.  Fill these as soon as possible after surgery and take the medication as directed. Pain medications will not be refilled on weekends and must be approved by the doctor. 14. Remove nail polish on the operative foot and avoid getting pedicures prior to surgery. 15. Wash the night before surgery.  The night before surgery wash the foot and leg well with water and the antibacterial soap provided. Be sure to pay special attention to beneath the toenails and in between the toes.  Wash for at least three (3) minutes. Rinse thoroughly with water and dry well with a towel.  Perform this wash unless told not to do so by your physician.  Enclosed: 1 Ice pack (please put in freezer the night before surgery)   1 Hibiclens skin cleaner     Pre-op instructions  If you have any questions regarding the instructions, please do not hesitate to call our office.  [place]: [address], [place], [place] [address] -- [phone]  [place]: [address] [place], [place] [address] -- [phone]  [place]: [address]  [place], [place] [address] -- [phone]  [place]: [address], [address], [place], [place] [address] -- [phone]  Website:  [email or web address] 

## 2017-10-14 NOTE — Progress Notes (Signed)
Subjective:    Patient ID: [name], female   DOB: 61 y.o.   MRN: [id number]   HPI patient presents to discuss correction chronic bunion deformity stating the heel is feeling quite a bit better    ROS      Objective:  Physical Exam neurovascular status intact with hyperostosis medial aspect first metatarsal with redness pain noted with deviation of the hallux and also plantar pain which has improved     Assessment:     Improved fasciitis with structural bunion deformity    Plan:    H&P both conditions reviewed and recommended correction of bunion allowing patient to read consent form discussing alternative treatments complications. Patient wants surgery and after extensive review signs consent form is given all preoperative instructions and boot for the postoperative period patient will be seen back for [place] to recheck again in is encouraged to call with any questions and understands total recovery take 6 months to one year

## 2017-10-21 ENCOUNTER — Telehealth: Payer: Self-pay | Admitting: *Deleted

## 2017-10-21 NOTE — Telephone Encounter (Signed)
I'm returning your call.  Dr. Paulla Dolly said it's okay for you to continue taking the Kratom.  "I have another question.  Is it okay to get a pedicure?"  Do not get a pedicure.  Do not wear any toenail polish day of surgery.  "Okay, I knew about the nail polish.  I have some ugly toenails.  I just wanted to make sure."

## 2017-10-21 NOTE — Telephone Encounter (Signed)
"  Dr. Paulla Dolly is my doctor.  I've got surgery scheduled for my bunion on the Tuesday after Thanksgiving.  I think it's the 27th.  My question is do I need to stop taking Kratom, it's a pain killer I take for my back and my foot too.  I know you're supposed to stop some medications before surgery.  I'm not sure if I told him about that.  I just want to make sure of if I need to pull that back a week or two before the surgery.  He may not have even heard of it, a lot of doctors haven't."

## 2017-10-31 DIAGNOSIS — M4800 Spinal stenosis, site unspecified: Secondary | ICD-10-CM | POA: Insufficient documentation

## 2017-10-31 DIAGNOSIS — Z981 Arthrodesis status: Secondary | ICD-10-CM | POA: Insufficient documentation

## 2017-11-01 ENCOUNTER — Encounter: Payer: Self-pay | Admitting: Podiatry

## 2017-11-01 DIAGNOSIS — M2011 Hallux valgus (acquired), right foot: Secondary | ICD-10-CM | POA: Diagnosis not present

## 2017-11-02 ENCOUNTER — Telehealth: Payer: Self-pay | Admitting: Podiatry

## 2017-11-02 NOTE — Telephone Encounter (Signed)
I had foot surgery done by Dr. Paulla Dolly yesterday. I have some questions that I need to ask. If you would please call me back at [phone]. Thanks so much.

## 2017-11-02 NOTE — Telephone Encounter (Signed)
I spoke, she asked if she could drive, I told her no not until Dr. Paulla Dolly released her and not until she was in a normal shoe. Pt asked if she could take off the boot, and if she had to use crutches or a walker. I told pt to keep the boot on at all times, if resting and not sleeping she could remove the boot to rotate foot, but must put boot back on, and that she could use the crutches or walker for balance and to move off the surgery foot quickly. Pt states understanding and asked how long she needed to take the ibuprofen and I told her as long as she needed it for additional pain coverage.

## 2017-11-09 ENCOUNTER — Ambulatory Visit (INDEPENDENT_AMBULATORY_CARE_PROVIDER_SITE_OTHER): Payer: 59 | Admitting: Podiatry

## 2017-11-09 ENCOUNTER — Ambulatory Visit (INDEPENDENT_AMBULATORY_CARE_PROVIDER_SITE_OTHER): Payer: 59

## 2017-11-09 DIAGNOSIS — M2011 Hallux valgus (acquired), right foot: Secondary | ICD-10-CM | POA: Diagnosis not present

## 2017-11-09 MED ORDER — OXYCODONE-ACETAMINOPHEN 10-325 MG PO TABS: 1.0000 | ORAL_TABLET | Freq: Three times a day (TID) | ORAL | 0 refills | Status: AC | PRN

## 2017-11-09 NOTE — Progress Notes (Signed)
Subjective:   Patient ID: [name], female   DOB: 61 y.o.   MRN: [id number]   HPI Patient states doing well with mild discomfort if I walk too much but overall I am very pleased with results   ROS      Objective:  Physical Exam  Neurovascular status intact negative Homans sign was noted with patient's right foot healing well with wound edges well coapted good alignment of the metatarsal with good digital movement with no crepitus or restriction     Assessment:  Doing well post osteotomy right first metatarsal     Plan:  H&P condition reviewed recommend continue range of motion exercises anti-inflammatories and supportive shoe gear.  Patient will continue immobilization compression elevation and will be seen back in 2 weeks or earlier if any issues should occur.  X-rays indicate osteotomy is healing well joint congruous no indications of movement

## 2017-11-10 ENCOUNTER — Encounter: Payer: Self-pay | Admitting: Podiatry

## 2017-11-23 ENCOUNTER — Ambulatory Visit (INDEPENDENT_AMBULATORY_CARE_PROVIDER_SITE_OTHER): Payer: 59

## 2017-11-23 ENCOUNTER — Ambulatory Visit (INDEPENDENT_AMBULATORY_CARE_PROVIDER_SITE_OTHER): Payer: 59 | Admitting: Podiatry

## 2017-11-23 ENCOUNTER — Encounter: Payer: Self-pay | Admitting: Podiatry

## 2017-11-23 DIAGNOSIS — M2011 Hallux valgus (acquired), right foot: Secondary | ICD-10-CM

## 2017-11-23 NOTE — Progress Notes (Signed)
Subjective:   Patient ID: [name], female   DOB: 61 y.o.   MRN: [id number]   HPI Patient states doing well but has occasional swelling if she is on her foot too much   ROS      Objective:  Physical Exam  Neurovascular status intact negative Homans sign noted with patient's right foot healing well with wound edges well coapted no crepitus of the joint and good alignment noted.  Patient does have moderate restriction of motion and admits that she has not been aggressive to try to move the big toe     Assessment:  Doing well the patient is not aggressive in motion exercises and is scared to bend her big toe post surgery     Plan:  H&P x-rays reviewed and discussed the importance of digital movement.  At this point I did go ahead and I am sending for several weeks of physical therapy to encourage motion and teach her techniques to increase the motion as this is the.  We want to do this.  Patient will be seen back for [place] to recheck again in 3-4 weeks or earlier if needed  X-rays indicate the osteotomy is healing well with joint open wound edges well coapted and pins in place

## 2017-12-21 ENCOUNTER — Encounter: Payer: Self-pay | Admitting: Podiatry

## 2017-12-21 ENCOUNTER — Ambulatory Visit (INDEPENDENT_AMBULATORY_CARE_PROVIDER_SITE_OTHER): Payer: 59 | Admitting: Podiatry

## 2017-12-21 ENCOUNTER — Ambulatory Visit (INDEPENDENT_AMBULATORY_CARE_PROVIDER_SITE_OTHER): Payer: 59

## 2017-12-21 DIAGNOSIS — M7661 Achilles tendinitis, right leg: Secondary | ICD-10-CM

## 2017-12-21 DIAGNOSIS — M2011 Hallux valgus (acquired), right foot: Secondary | ICD-10-CM

## 2017-12-26 NOTE — Progress Notes (Signed)
Subjective:   Patient ID: [name], female   DOB: 62 y.o.   MRN: [id number]   HPI Patient presents stating overall doing well with mild pain in the back of the heel but overall is walking well   ROS      Objective:  Physical Exam  Neurovascular status intact negative Homans sign noted with patient's right incision site well coapted with hallux in rectus position with good range of motion     Assessment:  Doing well post osteotomy first metatarsal right with good range of motion no crepitus     Plan:  Reviewed x-ray condition and encouraged of the importance of continued exercise continued movement of the big toe.  Wear stable shoes and patient will be seen back and is encouraged to call with any questions  X-ray reviewed includes fixation in place joint open no indications of movement

## 2018-01-06 NOTE — Progress Notes (Signed)
DOS 11/01/17 BI-plantar osteotomy w removal bone spurs Rt 1st MPJ

## 2018-02-01 ENCOUNTER — Other Ambulatory Visit: Payer: Self-pay | Admitting: Podiatry

## 2018-02-01 ENCOUNTER — Ambulatory Visit (INDEPENDENT_AMBULATORY_CARE_PROVIDER_SITE_OTHER): Payer: 59

## 2018-02-01 ENCOUNTER — Ambulatory Visit (INDEPENDENT_AMBULATORY_CARE_PROVIDER_SITE_OTHER): Payer: 59 | Admitting: Podiatry

## 2018-02-01 ENCOUNTER — Encounter: Payer: Self-pay | Admitting: Podiatry

## 2018-02-01 DIAGNOSIS — M2011 Hallux valgus (acquired), right foot: Secondary | ICD-10-CM

## 2018-02-01 DIAGNOSIS — M2010 Hallux valgus (acquired), unspecified foot: Secondary | ICD-10-CM

## 2018-02-01 DIAGNOSIS — M79671 Pain in right foot: Secondary | ICD-10-CM

## 2018-02-02 NOTE — Progress Notes (Signed)
Subjective:   Patient ID: [name], female   DOB: 62 y.o.   MRN: [id number]   HPI Patient states it seems that the motion is improving and I am not having any pain in his joint at the current time   ROS      Objective:  Physical Exam  Neurovascular status intact negative Homans sign was noted with patient's right first MPJ healing well with wound edges well coapted range of motion improving with physical therapy     Assessment:  Doing well post Liane Comber type osteotomy right first metatarsal     Plan:  H&P condition reviewed and advised on continued physical therapy range of motion exercises and increases in activity levels.  Patient will be checked back in 1 month or earlier if needed  X-rays indicated that the bone is healing well the joint is open with no indications of pathology

## 2018-02-24 DIAGNOSIS — N840 Polyp of corpus uteri: Secondary | ICD-10-CM | POA: Insufficient documentation

## 2018-02-24 DIAGNOSIS — N950 Postmenopausal bleeding: Secondary | ICD-10-CM | POA: Insufficient documentation

## 2018-03-01 ENCOUNTER — Encounter: Payer: 59 | Admitting: Podiatry

## 2018-03-17 ENCOUNTER — Encounter: Payer: Self-pay | Admitting: Podiatry

## 2018-03-17 ENCOUNTER — Ambulatory Visit: Payer: 59 | Admitting: Podiatry

## 2018-03-17 ENCOUNTER — Other Ambulatory Visit: Payer: Self-pay | Admitting: Podiatry

## 2018-03-17 ENCOUNTER — Ambulatory Visit (INDEPENDENT_AMBULATORY_CARE_PROVIDER_SITE_OTHER): Payer: 59

## 2018-03-17 DIAGNOSIS — M2011 Hallux valgus (acquired), right foot: Secondary | ICD-10-CM

## 2018-03-20 NOTE — Progress Notes (Signed)
Subjective:   Patient ID: [name], female   DOB: 62 y.o.   MRN: [id number]   HPI Patient presents stating I am doing well with occasional discomfort but overall much better   ROS      Objective:  Physical Exam  Neurovascular status intact muscle strength is adequate range of motion within normal limits with patient noted to have mild reduction of motion first MPJ right but overall adequate with no crepitus of the joint     Assessment:  Doing well post osteotomy right first metatarsal     Plan:  Final x-rays reviewed and advised on continuation of range of motion exercises and patient will be seen back on an as-needed basis  X-rays indicate fixation is in place no indications of current arthritis of the joint with good healing from previous osteotomy

## 2018-08-14 NOTE — Progress Notes (Signed)
This encounter was created in error - please disregard.

## 2019-09-11 DIAGNOSIS — K760 Fatty (change of) liver, not elsewhere classified: Secondary | ICD-10-CM | POA: Insufficient documentation

## 2019-09-11 DIAGNOSIS — K7460 Unspecified cirrhosis of liver: Secondary | ICD-10-CM | POA: Insufficient documentation

## 2021-03-02 ENCOUNTER — Ambulatory Visit: Payer: 59 | Admitting: Podiatry

## 2021-03-02 ENCOUNTER — Encounter: Payer: Self-pay | Admitting: Podiatry

## 2021-03-02 ENCOUNTER — Other Ambulatory Visit: Payer: Self-pay

## 2021-03-02 ENCOUNTER — Ambulatory Visit (INDEPENDENT_AMBULATORY_CARE_PROVIDER_SITE_OTHER): Payer: 59

## 2021-03-02 DIAGNOSIS — Z472 Encounter for removal of internal fixation device: Secondary | ICD-10-CM | POA: Diagnosis not present

## 2021-03-02 DIAGNOSIS — M21611 Bunion of right foot: Secondary | ICD-10-CM

## 2021-03-02 DIAGNOSIS — M21619 Bunion of unspecified foot: Secondary | ICD-10-CM | POA: Diagnosis not present

## 2021-03-02 NOTE — Patient Instructions (Signed)
Bunion A bunion (hallux valgus) is a bump that forms slowly on the inner side of the big toe joint. It occurs when the big toe turns toward the second toe. Bunions may be small at first, but they often get larger over time. They can make walking painful. What are the causes? This condition may be caused by:  Wearing narrow or pointed shoes that force the big toe to press against the other toes.  Abnormal foot development that causes the foot to roll inward.  Changes in the foot that are caused by certain diseases, such as rheumatoid arthritis or polio.  A foot injury. What increases the risk? The following factors may make you more likely to develop this condition:  Wearing shoes that squeeze the toes together.  Having certain diseases, such as: ? Rheumatoid arthritis. ? Polio. ? Cerebral palsy.  Having family members who have bunions.  Being born with abnormally shaped feet (a foot deformity), such as flat feet or low arches.  Doing activities that put a lot of pressure on the feet, such as ballet dancing. What are the signs or symptoms? The main symptom of this condition is a bump on your big toe that you can notice. Other symptoms may include:  Pain.  Redness and inflammation around your big toe.  Thick or hardened skin on your big toe or between your toes.  Stiffness or loss of motion in your big toe.  Trouble with walking.   How is this diagnosed? This condition may be diagnosed based on your symptoms, medical history, and activities. You may also have tests and imaging, such as:  X-rays. These allow your health care provider to check the position of the bones in your foot and look for damage to your joint. They also help your health care provider determine the severity of your bunion and the best way to treat it.  Joint aspiration. In this test, a sample of fluid is removed from the toe joint. This test may be done if you are in a lot of pain. It helps rule out  diseases that cause painful swelling of the joints, such as arthritis or gout. How is this treated? Treatment depends on the severity of your symptoms. The goal of treatment is to relieve symptoms and prevent your bunion from getting worse. Your health care provider may recommend:  Wearing shoes that have a wide toe box, or using bunion pads to cushion the affected area.  Taping your toes together to keep them in a normal position.  Placing a device inside your shoe (orthotic device) to help reduce pressure on your toe joint.  Taking medicine to ease pain and inflammation.  Putting ice or heat on the affected area.  Doing stretching exercises.  Surgery, for severe cases. Follow these instructions at home: Managing pain, stiffness, and swelling  If directed, put ice on the painful area. To do this: ? Put ice in a plastic bag. ? Place a towel between your skin and the bag. ? Leave the ice on for 20 minutes, 2-3 times a day. ? Remove the ice if your skin turns bright red. This is very important. If you cannot feel pain, heat, or cold, you have a greater risk of damage to the area.  If directed, apply heat to the affected area before you exercise. Use the heat source that your health care provider recommends, such as a moist heat pack or a heating pad. ? Place a towel between your skin and the   heat source. ? Leave the heat on for 20-30 minutes. ? Remove the heat if your skin turns bright red. This is especially important if you are unable to feel pain, heat, or cold. You have a greater risk of getting burned.      General instructions  Do exercises as told by your health care provider.  Support your toe joint with proper footwear, shoe padding, or taping as told by your health care provider.  Take over-the-counter and prescription medicines only as told by your health care provider.  Do not use any products that contain nicotine or tobacco, such as cigarettes, e-cigarettes, and  chewing tobacco. If you need help quitting, ask your health care provider.  Keep all follow-up visits. This is important. Contact a health care provider if:  Your symptoms get worse.  Your symptoms do not improve in 2 weeks. Get help right away if:  You have severe pain and trouble with walking. Summary  A bunion is a bump on the inner side of the big toe joint that forms when the big toe turns toward the second toe.  Bunions can make walking painful.  Treatment depends on the severity of your symptoms.  Support your toe joint with proper footwear, shoe padding, or taping as told by your health care provider. This information is not intended to replace advice given to you by your health care provider. Make sure you discuss any questions you have with your health care provider. Document Revised: 03/28/2020 Document Reviewed: 03/28/2020 Elsevier Patient Education  2021 [employer]  

## 2021-03-03 ENCOUNTER — Telehealth: Payer: Self-pay | Admitting: Urology

## 2021-03-03 NOTE — Telephone Encounter (Signed)
DOS: 03/11/21  REMOVAL OF 2 PINS RIGHT --- 20680  [facility] EFFECTIVE DATE - 12/06/20   PLAN DEDUCTIBLE -  $500.00 W/ $0.00 REMAINING OUT OF POCKET - $5,000.00 W/  $4,862.82 REMAINING COPAY - $150.00 CINSURANCE - 20%    PER [employer] WEBSITE  FOR CPT CODE 41753, Notification or Prior Authorization is not required for the requested services   Decision ID #:[id number]

## 2021-03-04 NOTE — Progress Notes (Signed)
Subjective:   Patient ID: [name], female   DOB: 65 y.o.   MRN: [id number]   HPI Patient states that she had surgery done in 2019 which is done well but that she has traumatized her foot a number of times and she is concerned that there could be damage and she wanted it checked neuro   ROS      Objective:  Physical Exam  Vascular status was found to be intact with patient found to have well-healed incision first MPJ right with moderate diminishment of motion but no crepitus within the joint mild discomfort around the first MPJ localized     Assessment:  Patient's had a number of traumatic events with the right foot and appears to have moderate inflammatory condition but does not appear to have severe reoccurrence of arthritis of the joint      Plan:  H&P condition reviewed.  I have recommended the utilization of rigid bottom shoes and anti-inflammatories as needed and I did discuss the probability for low-grade cartilage damage.  Hopefully this will remain like it is and not get worse for her over time.  I did advise her on topical medicine she could use and physical therapy and the possibility for orthotics  X-rays indicate there is good healing of the osteotomy there is some punctate changes around the joint but it is rounded

## 2021-03-11 ENCOUNTER — Encounter: Payer: Self-pay | Admitting: Podiatry

## 2021-03-11 ENCOUNTER — Ambulatory Visit: Payer: 59 | Admitting: Podiatry

## 2021-03-11 ENCOUNTER — Other Ambulatory Visit: Payer: Self-pay

## 2021-03-11 VITALS — BP 135/85 | HR 88 | Temp 99.4°F | Resp 16

## 2021-03-11 DIAGNOSIS — M1990 Unspecified osteoarthritis, unspecified site: Secondary | ICD-10-CM | POA: Insufficient documentation

## 2021-03-11 DIAGNOSIS — Z472 Encounter for removal of internal fixation device: Secondary | ICD-10-CM | POA: Diagnosis not present

## 2021-03-11 DIAGNOSIS — K219 Gastro-esophageal reflux disease without esophagitis: Secondary | ICD-10-CM | POA: Insufficient documentation

## 2021-03-11 DIAGNOSIS — K449 Diaphragmatic hernia without obstruction or gangrene: Secondary | ICD-10-CM | POA: Insufficient documentation

## 2021-03-11 DIAGNOSIS — G4733 Obstructive sleep apnea (adult) (pediatric): Secondary | ICD-10-CM | POA: Insufficient documentation

## 2021-03-11 DIAGNOSIS — K5790 Diverticulosis of intestine, part unspecified, without perforation or abscess without bleeding: Secondary | ICD-10-CM | POA: Insufficient documentation

## 2021-03-11 NOTE — Progress Notes (Signed)
Subjective:   Patient ID: [name], female   DOB: 65 y.o.   MRN: [id number]   HPI Patient presents to office for surgical removal of 2 irritated pins dorsum right first metatarsal   ROS      Objective:  Physical Exam  Neurovascular status intact with prominence of pins on the dorsal aspect right first metatarsal shaft right with previous surgery done a number of years ago     Assessment:  Prominent pin with irritation tissue right first metatarsal shaft     Plan:  Patient was anesthetized with 60 mg like Marcaine mixture patient's right foot was prepped and draped utilizing standard aseptic technique was brought to the OR in the right ankle tourniquet was inflated to 250 mmHg.  Following procedure was performed.  Removal of proximal pin right Tenstrike dorsal aspect right where an approximate 2 cm incision was made over the shaft of the first metatarsal.  The incision was deepened through subcu tissue down to capsule and a linear capsular incision was performed.  Capsular tissues sharply dissected off the underlying bone of the proximal pin was removed in toto.  I then went ahead and extended the incision slightly distal and procedure to removal of distal pin accomplished.  The incision was deepened through the capsule and the distal pin was identified and removed in toto.  The wound was flushed with copious amounts sterile Garamycin solution and sutured with 4-0 nylon and sterile dressing applied the tourniquet read released and capillary fill noted immediate to all digits on the right foot.  Patient left the OR in satisfactory condition with surgical shoe

## 2021-03-16 ENCOUNTER — Telehealth: Payer: Self-pay

## 2021-03-16 NOTE — Telephone Encounter (Signed)
Pt would like to know when she can get her incision wet. Pt had her pins taken out last week. Please advise

## 2021-03-16 NOTE — Telephone Encounter (Signed)
now

## 2021-03-18 ENCOUNTER — Telehealth: Payer: Self-pay | Admitting: Podiatry

## 2021-03-18 NOTE — Telephone Encounter (Signed)
Patient called in wanting to know if she can apply water to previous surgical area, stated they are currently covered with bandages Please Advise

## 2021-03-18 NOTE — Telephone Encounter (Signed)
yes

## 2021-03-25 ENCOUNTER — Ambulatory Visit (INDEPENDENT_AMBULATORY_CARE_PROVIDER_SITE_OTHER): Payer: 59

## 2021-03-25 ENCOUNTER — Ambulatory Visit (INDEPENDENT_AMBULATORY_CARE_PROVIDER_SITE_OTHER): Payer: 59 | Admitting: Podiatry

## 2021-03-25 ENCOUNTER — Encounter: Payer: Self-pay | Admitting: Podiatry

## 2021-03-25 ENCOUNTER — Other Ambulatory Visit: Payer: Self-pay

## 2021-03-25 DIAGNOSIS — Z472 Encounter for removal of internal fixation device: Secondary | ICD-10-CM

## 2021-03-26 NOTE — Progress Notes (Signed)
Subjective:   Patient ID: [name], female   DOB: 65 y.o.   MRN: [id number]   HPI Patient states doing well minimal discomfort ready to get stitches out neuro   ROS      Objective:  Physical Exam  Vascular status intact negative Bevelyn Buckles' sign noted wound edges coapted well stitches intact no pain     Assessment:  Doing well removal of 2 pins right foot     Plan:  Stitch removal wound edges coapted well sterile dressing applied advised on continued elevation as needed and bandage usage and reappoint as needed  X-rays indicated satisfactory removal of 2 pins on the dorsal right foot

## 2022-06-07 ENCOUNTER — Other Ambulatory Visit: Payer: Self-pay | Admitting: Physician Assistant

## 2022-06-07 DIAGNOSIS — K7460 Unspecified cirrhosis of liver: Secondary | ICD-10-CM

## 2022-07-08 ENCOUNTER — Ambulatory Visit (INDEPENDENT_AMBULATORY_CARE_PROVIDER_SITE_OTHER): Payer: 59

## 2022-07-08 ENCOUNTER — Encounter: Payer: Self-pay | Admitting: Podiatry

## 2022-07-08 ENCOUNTER — Ambulatory Visit: Payer: 59 | Admitting: Podiatry

## 2022-07-08 DIAGNOSIS — M779 Enthesopathy, unspecified: Secondary | ICD-10-CM

## 2022-07-08 DIAGNOSIS — M79671 Pain in right foot: Secondary | ICD-10-CM

## 2022-07-08 DIAGNOSIS — M7671 Peroneal tendinitis, right leg: Secondary | ICD-10-CM | POA: Diagnosis not present

## 2022-07-08 DIAGNOSIS — M7670 Peroneal tendinitis, unspecified leg: Secondary | ICD-10-CM

## 2022-07-08 DIAGNOSIS — M7672 Peroneal tendinitis, left leg: Secondary | ICD-10-CM

## 2022-07-08 MED ORDER — TRIAMCINOLONE ACETONIDE 10 MG/ML IJ SUSP
20.0000 mg | Freq: Once | INTRAMUSCULAR | Status: AC
  Administered 2022-07-08: 20 mg

## 2022-07-10 NOTE — Progress Notes (Signed)
Subjective:   Patient ID: [name], female   DOB: 66 y.o.   MRN: [id number]   HPI Patient states she has developed a lot of pain on the outside of both feet and is not sure what may have caused it.  States its been very inflamed and not as bad as it was but still sore   ROS      Objective:  Physical Exam  Neurovascular status intact with palpation indicating quite a bit of discomfort at the peroneal tendon insertion into the base of the fifth metatarsal bilateral with no indication of muscle strength loss     Assessment:  Peroneal tendinitis at insertion base of fifth metatarsal bilateral with fluid buildup which may have been due to shoe gear or activity usage     Plan:  H&P reviewed condition went ahead today did do sterile prep explained injections and risk and injected the insertion fifth metatarsal base peroneal insertion 3 mg dexamethasone Kenalog 5 mg Xylocaine into the sheath.  Reappoint to recheck  X-rays indicate there is some reactivity around the base of the fifth metatarsal bilateral with small spur formation no other indications of acute process

## 2022-07-16 ENCOUNTER — Other Ambulatory Visit: Payer: Self-pay | Admitting: Podiatry

## 2022-07-16 DIAGNOSIS — M779 Enthesopathy, unspecified: Secondary | ICD-10-CM
